# Patient Record
Sex: Male | Born: 1970 | Race: Black or African American | Hispanic: No | Marital: Single | Smoking: Current every day smoker
Health system: Southern US, Community
[De-identification: ages and names within clinical notes are randomized; demographics above are authoritative.]

---

## 1998-12-22 ENCOUNTER — Encounter: Payer: Self-pay | Admitting: Emergency Medicine

## 1998-12-22 ENCOUNTER — Emergency Department (HOSPITAL_COMMUNITY): Admission: EM | Admit: 1998-12-22 | Discharge: 1998-12-22 | Payer: Self-pay | Admitting: Emergency Medicine

## 2020-09-21 ENCOUNTER — Other Ambulatory Visit: Payer: Self-pay

## 2020-09-21 ENCOUNTER — Encounter (HOSPITAL_BASED_OUTPATIENT_CLINIC_OR_DEPARTMENT_OTHER): Payer: Self-pay | Admitting: Emergency Medicine

## 2020-09-21 ENCOUNTER — Emergency Department (HOSPITAL_BASED_OUTPATIENT_CLINIC_OR_DEPARTMENT_OTHER)
Admission: EM | Admit: 2020-09-21 | Discharge: 2020-09-21 | Disposition: A | Discharge status: Home / Self Care | Payer: Self-pay | Attending: Emergency Medicine | Admitting: Emergency Medicine

## 2020-09-21 ENCOUNTER — Emergency Department (HOSPITAL_BASED_OUTPATIENT_CLINIC_OR_DEPARTMENT_OTHER): Payer: Self-pay | Admitting: Radiology

## 2020-09-21 DIAGNOSIS — S4992XA Unspecified injury of left shoulder and upper arm, initial encounter: Secondary | ICD-10-CM | POA: Insufficient documentation

## 2020-09-21 DIAGNOSIS — W228XXA Striking against or struck by other objects, initial encounter: Secondary | ICD-10-CM | POA: Insufficient documentation

## 2020-09-21 NOTE — Discharge Instructions (Signed)
X-ray of the left shoulder without any bony abnormalities.  Discharged in the custody of police.  Would recommend follow-up with sports medicine.  Referral information provided.
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
## 2022-01-15 ENCOUNTER — Emergency Department (HOSPITAL_COMMUNITY)
Admission: EM | Admit: 2022-01-15 | Discharge: 2022-01-16 | Disposition: A | Discharge status: Home / Self Care | Payer: BC Managed Care – PPO | Attending: Emergency Medicine | Admitting: Emergency Medicine

## 2022-01-15 ENCOUNTER — Encounter (HOSPITAL_COMMUNITY): Payer: Self-pay

## 2022-01-15 ENCOUNTER — Other Ambulatory Visit: Payer: Self-pay

## 2022-01-15 DIAGNOSIS — F1410 Cocaine abuse, uncomplicated: Secondary | ICD-10-CM | POA: Diagnosis not present

## 2022-01-15 DIAGNOSIS — Z20822 Contact with and (suspected) exposure to covid-19: Secondary | ICD-10-CM | POA: Insufficient documentation

## 2022-01-15 DIAGNOSIS — R45851 Suicidal ideations: Secondary | ICD-10-CM | POA: Diagnosis not present

## 2022-01-15 DIAGNOSIS — F332 Major depressive disorder, recurrent severe without psychotic features: Secondary | ICD-10-CM | POA: Diagnosis not present

## 2022-01-15 DIAGNOSIS — F32A Depression, unspecified: Secondary | ICD-10-CM | POA: Diagnosis present

## 2022-01-15 LAB — COMPREHENSIVE METABOLIC PANEL
ALT: 31 U/L (ref 0–44)
AST: 39 U/L (ref 15–41)
Albumin: 4.7 g/dL (ref 3.5–5.0)
Alkaline Phosphatase: 75 U/L (ref 38–126)
Anion gap: 7 (ref 5–15)
BUN: 19 mg/dL (ref 6–20)
CO2: 24 mmol/L (ref 22–32)
Calcium: 9.6 mg/dL (ref 8.9–10.3)
Chloride: 106 mmol/L (ref 98–111)
Creatinine, Ser: 0.95 mg/dL (ref 0.61–1.24)
GFR, Estimated: >60 mL/min (ref >60)
Glucose, Bld: 113 mg/dL — ABNORMAL HIGH (ref 70–99)
Potassium: 3.7 mmol/L (ref 3.5–5.1)
Sodium: 137 mmol/L (ref 135–145)
Total Bilirubin: 1.1 mg/dL (ref 0.3–1.2)
Total Protein: 8.3 g/dL — ABNORMAL HIGH (ref 6.5–8.1)

## 2022-01-15 LAB — ETHANOL: Alcohol, Ethyl (B): <10 mg/dL (ref <10)

## 2022-01-15 LAB — CBC
HCT: 44.4 % (ref 39.0–52.0)
Hemoglobin: 14.9 g/dL (ref 13.0–17.0)
MCH: 30.4 pg (ref 26.0–34.0)
MCHC: 33.6 g/dL (ref 30.0–36.0)
MCV: 90.6 fL (ref 80.0–100.0)
Platelets: 350 10*3/uL (ref 150–400)
RBC: 4.9 MIL/uL (ref 4.22–5.81)
RDW: 13.9 % (ref 11.5–15.5)
WBC: 6.1 10*3/uL (ref 4.0–10.5)
nRBC: 0 % (ref 0.0–0.2)

## 2022-01-15 LAB — SARS CORONAVIRUS 2 BY RT PCR: SARS Coronavirus 2 by RT PCR: NEGATIVE

## 2022-01-15 LAB — ACETAMINOPHEN LEVEL: Acetaminophen (Tylenol), Serum: <10 ug/mL — ABNORMAL LOW (ref 10–30)

## 2022-01-15 MED ORDER — SERTRALINE HCL 50 MG PO TABS
100.0000 mg | ORAL_TABLET | Freq: Every day | ORAL | Status: DC
  Administered 2022-01-15 – 2022-01-16 (×2): 100 mg via ORAL
  Filled 2022-01-15 (×2): qty 2

## 2022-01-15 MED ORDER — TRAZODONE HCL 100 MG PO TABS
50.0000 mg | ORAL_TABLET | Freq: Every day | ORAL | Status: DC
  Administered 2022-01-16: 50 mg via ORAL
  Filled 2022-01-15: qty 1

## 2022-01-15 MED ORDER — HYDROXYZINE HCL 25 MG PO TABS
25.0000 mg | ORAL_TABLET | Freq: Three times a day (TID) | ORAL | Status: DC | PRN
  Administered 2022-01-15: 25 mg via ORAL
  Filled 2022-01-15: qty 1

## 2022-01-15 NOTE — ED Notes (Signed)
Per [facility] admissions, pt has been accepted to [facility] 2-west unit. Accepting provider is Dr. Raj. Patient can arrive 01/16/2022 after 6:00am. Number for report is [phone] or [phone]. 

## 2022-01-15 NOTE — Progress Notes (Signed)
Per [facility] admissions, pt has been accepted to [facility] 2-west unit. Accepting provider is Dr. Merrie Roof. Patient can arrive 01/16/2022 after 6:00am. Number for report is [phone] or [phone].   Glennie Isle, MSW, LCSW-A Phone: [phone] Disposition/TOC

## 2022-01-15 NOTE — ED Notes (Signed)
Pt belongings consist of 2 labelled pt bags. 1 bag only has his shoes and bag 2 has his pants, shirt, sweater, earrings, and phone. 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

## 2022-01-15 NOTE — ED Provider Notes (Signed)
[facility] DEPT Provider Note   CSN: [id number] Arrival date & time: 01/15/22  0258     History  Chief Complaint  Patient presents with   Suicidal    Keith Hardy is a 51 y.o. male.  HPI Patient presents with suicidal thoughts.  Substance use.  Reportedly has been using crack cocaine.  Reportedly has history of depression.  Had been on Zoloft.  States he has not had that for the last couple weeks because he has been using drugs heavily.  Does have depression in the past but normally rather well controlled.  Now having suicidal thoughts with a plan to crash his car.  Called police.  Tearful.  Reportedly otherwise healthy.  Denies other drug use.   History reviewed. No pertinent past medical history.  Home Medications Prior to Admission medications   Not on File      Allergies    Patient has no known allergies.    Review of Systems   Review of Systems  Physical Exam Updated Vital Signs BP 113/79 (BP Location: Left Arm)   Pulse 71   Temp 98.5 F (36.9 C) (Oral)   Resp (!) 22   SpO2 95%  Physical Exam Vitals and nursing note reviewed.  HENT:     Head: Atraumatic.  Cardiovascular:     Rate and Rhythm: Normal rate.  Abdominal:     Tenderness: There is no abdominal tenderness.  Musculoskeletal:        General: No tenderness.     Cervical back: Neck supple.  Skin:    General: Skin is warm.  Neurological:     Mental Status: He is alert.  Psychiatric:     Comments: Patient is cheerful.  Does not appear to responding to internal stimuli.     ED Results / Procedures / Treatments   Labs (all labs ordered are listed, but only abnormal results are displayed) Labs Reviewed  COMPREHENSIVE METABOLIC PANEL - Abnormal; Notable for the following components:      Result Value   Glucose, Bld 113 (*)    Total Protein 8.3 (*)    All other components within normal limits  ACETAMINOPHEN LEVEL - Abnormal; Notable for the following components:    Acetaminophen (Tylenol), Serum <10 (*)    All other components within normal limits  SARS CORONAVIRUS 2 BY RT PCR  CBC  ETHANOL  RAPID URINE DRUG SCREEN, HOSP PERFORMED    EKG None  Radiology No results found.  Procedures Procedures    Medications Ordered in ED Medications - No data to display  ED Course/ Medical Decision Making/ A&P                           Medical Decision Making Amount and/or Complexity of Data Reviewed Labs: ordered.   Patient presents for medical clearance.  Suicidal.  Is also substance use with cocaine.  Active suicidal thoughts with plan to crash his car.  We will get basic blood work.  Likely will be medically cleared however.  We will have patient seen by TTS.  Lab work returned and reassuring.  Patient is medically cleared at this time.        Final Clinical Impression(s) / ED Diagnoses Final diagnoses:  Suicidal ideation  Cocaine use disorder [facility])    Rx / DC Orders ED Discharge Orders     None         Davonna Belling, MD 01/15/22 1116

## 2022-01-15 NOTE — Consult Note (Cosign Needed Addendum)
[facility] ED ASSESSMENT   Reason for Consult:  Psychiatry evaluation Referring Physician:  ER Physician Patient Identification: Keith Hardy MRN:  [id number] ED Chief Complaint: Major depressive disorder, recurrent episode, severe ([place])  Diagnosis:  Principal Problem:   Major depressive disorder, recurrent episode, severe ([place]) Active Problems:   Suicide ideation   Cocaine use disorder [facility])   ED Assessment Time Calculation: Start Time: 1522 Stop Time: 1546 Total Time in Minutes (Assessment Completion): 24   Subjective:   Keith Hardy is a 51 y.o. male patient admitted with suicide ideation and severe depression in the contest of homelessness and tired of living in his car.  He was using crack cocaine daily to see if he can end it all.  Patient called PD from his car and was transported here.   First visit to [facility] long ER  but has previous hx of depression and anxiety.Marland Kitchen  HPI:  Patient was seen this afternoon tearful, alert and oriented x 5 and he made minimal eye contact with provider.  Patient reported he is unemployed, [place] but has no relationship with his family members.  Patient was hospitalized 3 weeks ago for same symptoms at [facility] unit in [place].  He reported that he is not with his Zoloft but cannot state if he ran out or he left it some where.  He remains suicidal with plan to hang himself or shoot himself although he has no gun.   He also told the ER physician that he plans to crash his car for suicide ideation.  Patient reported poor sleep and appetite stating Cocaine takes away his appetite.   He reported hopelessness and helplessness. Patient denies HI/AVH and no paranoia.  Patient is irritable at times during our interaction because he believes too much questions are asked.  We will seek inpatient psychiatric hospitalization.  We will also start his Zoloft while seeking bed placement.  Past Psychiatric History: Depression, anxiety  and Cocaine abuse.  Recent inpatient Psychiatric hospitalization at [facility] in [place].  Reported previous suicide attempt by hanging.  Risk to Self or Others: Is the patient at risk to self? Yes Has the patient been a risk to self in the past 6 months? Yes Has the patient been a risk to self within the distant past? Yes Is the patient a risk to others? No Has the patient been a risk to others in the past 6 months? No Has the patient been a risk to others within the distant past? No  [place] Scale:  [place] ED from 01/15/2022 in [facility] DEPT ED from 09/21/2020 in [facility] Emergency Dept  C-SSRS RISK CATEGORY High Risk Error: Question 6 not populated       AIMS:  , , ,  ,   ASAM:    Substance Abuse:     Past Medical History: History reviewed. No pertinent past medical history. History reviewed. No pertinent surgical history. Family History: History reviewed. No pertinent family history. Family Psychiatric  History: unknown Social History:  Social History   Substance and Sexual Activity  Alcohol Use Yes   Comment: Ocasional     Social History   Substance and Sexual Activity  Drug Use Yes   Types: Cocaine    Social History   Socioeconomic History   Marital status: Single    Spouse name: Not on file   Number of children: Not on file   Years of education: Not on  file   Highest education level: Not on file  Occupational History   Not on file  Tobacco Use   Smoking status: Every Day    Types: Cigarettes   Smokeless tobacco: Not on file  Vaping Use   Vaping Use: Never used  Substance and Sexual Activity   Alcohol use: Yes    Comment: Ocasional   Drug use: Yes    Types: Cocaine   Sexual activity: Not on file  Other Topics Concern   Not on file  Social History Narrative   Not on file   Social Determinants of Health   Financial Resource Strain: Not on file  Food Insecurity: Not on file  Transportation  Needs: Not on file  Physical Activity: Not on file  Stress: Not on file  Social Connections: Not on file   Additional Social History:    Allergies:  No Known Allergies  Labs:  Results for orders placed or performed during the hospital encounter of 01/15/22 (from the past 48 hour(s))  Comprehensive metabolic panel     Status: Abnormal   Collection Time: 01/15/22  9:50 AM  Result Value Ref Range   Sodium 137 135 - 145 mmol/L   Potassium 3.7 3.5 - 5.1 mmol/L   Chloride 106 98 - 111 mmol/L   CO2 24 22 - 32 mmol/L   Glucose, Bld 113 (H) 70 - 99 mg/dL    Comment: Glucose reference range applies only to samples taken after fasting for at least 8 hours.   BUN 19 6 - 20 mg/dL   Creatinine, Ser 9.37 0.61 - 1.24 mg/dL   Calcium 9.6 8.9 - 90.2 mg/dL   Total Protein 8.3 (H) 6.5 - 8.1 g/dL   Albumin 4.7 3.5 - 5.0 g/dL   AST 39 15 - 41 U/L   ALT 31 0 - 44 U/L   Alkaline Phosphatase 75 38 - 126 U/L   Total Bilirubin 1.1 0.3 - 1.2 mg/dL   GFR, Estimated >40 >97 mL/min    Comment: (NOTE) Calculated using the CKD-EPI Creatinine Equation (2021)    Anion gap 7 5 - 15    Comment: Performed at [facility], [address] [place], [place] [address]  CBC     Status: None   Collection Time: 01/15/22  9:50 AM  Result Value Ref Range   WBC 6.1 4.0 - 10.5 K/uL   RBC 4.90 4.22 - 5.81 MIL/uL   Hemoglobin 14.9 13.0 - 17.0 g/dL   HCT 92.4 26.8 - 34.1 %   MCV 90.6 80.0 - 100.0 fL   MCH 30.4 26.0 - 34.0 pg   MCHC 33.6 30.0 - 36.0 g/dL   RDW 96.2 22.9 - 79.8 %   Platelets 350 150 - 400 K/uL   nRBC 0.0 0.0 - 0.2 %    Comment: Performed at [facility], [address], [place], [place] [address]  Ethanol     Status: None   Collection Time: 01/15/22  9:50 AM  Result Value Ref Range   Alcohol, Ethyl (B) <10 <10 mg/dL    Comment: (NOTE) Lowest detectable limit for serum alcohol is 10 mg/dL.  For medical purposes only. Performed at [facility], [address] [place], [place] [address]   SARS Coronavirus 2 by RT PCR (hospital order, performed in [facility] hospital lab) *cepheid single result test*     Status: None   Collection Time: 01/15/22  9:50 AM   Specimen: Nasal Swab  Result Value Ref Range   SARS Coronavirus 2 by RT PCR NEGATIVE NEGATIVE    Comment: (NOTE) SARS-CoV-2 target nucleic acids are NOT DETECTED.  The SARS-CoV-2 RNA is generally detectable in upper and lower respiratory specimens during the acute phase of infection. The lowest concentration of SARS-CoV-2 viral copies this assay can detect is 250 copies / mL. A negative result does not preclude SARS-CoV-2 infection and should not be used as the sole basis for treatment or other patient management decisions.  A negative result may occur with improper specimen collection / handling, submission of specimen other than nasopharyngeal swab, presence of viral mutation(s) within the areas targeted by this assay, and inadequate number of viral copies (<250 copies / mL). A negative result must be combined with clinical observations, patient history, and epidemiological information.  Fact Sheet for Patients:   RoadLapTop.co.za  Fact Sheet for Healthcare Providers: http://kim-miller.com/  This test is not yet approved or  cleared by the [place] FDA and has been authorized for detection and/or diagnosis of SARS-CoV-2 by FDA under an Emergency Use Authorization (EUA).  This EUA will remain in effect (meaning this test can be used) for the duration of the COVID-19 declaration under Section 564(b)(1) of the Act, 21 U.S.C. section 360bbb-3(b)(1), unless the authorization is terminated or revoked sooner.  Performed at [facility], [address] [place], [place] [address]   Acetaminophen level     Status: Abnormal   Collection Time: 01/15/22  9:50 AM  Result Value Ref Range    Acetaminophen (Tylenol), Serum <10 (L) 10 - 30 ug/mL    Comment: (NOTE) Therapeutic concentrations vary significantly. A range of 10-30 ug/mL  may be an effective concentration for many patients. However, some  are best treated at concentrations outside of this range. Acetaminophen concentrations >150 ug/mL at 4 hours after ingestion  and >50 ug/mL at 12 hours after ingestion are often associated with  toxic reactions.  Performed at [facility], [address], [place], [place] [address]     Current Facility-Administered Medications  Medication Dose Route Frequency Provider Last Rate Last Admin   hydrOXYzine (ATARAX) tablet 25 mg  25 mg Oral TID PRN Dahlia Byes C, NP       sertraline (ZOLOFT) tablet 100 mg  100 mg Oral Daily Annie Saephan C, NP       traZODone (DESYREL) tablet 50 mg  50 mg Oral QHS Dahlia Byes C, NP       Current Outpatient Medications  Medication Sig Dispense Refill   ibuprofen (ADVIL) 200 MG tablet Take 400 mg by mouth 2 (two) times daily as needed (pain).     sertraline (ZOLOFT) 100 MG tablet Take 100 mg by mouth daily.      Musculoskeletal: Strength & Muscle Tone: within normal limits Gait & Station: normal Patient leans: Right   Psychiatric Specialty Exam: Presentation  General Appearance:  Casual; Fairly Groomed  Eye Contact: Minimal  Speech: Clear and Coherent; Normal Rate  Speech Volume: Normal  Handedness: Right   Mood and Affect  Mood: Depressed; Anxious; Angry; Irritable  Affect: Congruent; Tearful   Thought Process  Thought Processes: Goal Directed; Coherent  Descriptions of Associations:Intact  Orientation:Full (Time, Place and Person)  Thought Content:Logical  History of Schizophrenia/Schizoaffective disorder:No data recorded Duration of Psychotic Symptoms:No data recorded Hallucinations:Hallucinations: None  Ideas of Reference:None  Suicidal Thoughts:Suicidal Thoughts: Yes,  Active SI Active Intent and/or Plan: With Intent; With Plan; With Means to Carry Out; With  Access to Means (Plans to shoot self although he has no gun.  He plans to hang self, reported previous hanging self.)  Homicidal Thoughts:Homicidal Thoughts: No   Sensorium  Memory: Immediate Good; Recent Good; Remote Good  Judgment: Poor  Insight: Fair   Art therapist  Concentration: Good  Attention Span: Good  Recall: Good  Fund of Knowledge: Good  Language: Good   Psychomotor Activity  Psychomotor Activity: Psychomotor Activity: Normal   Assets  Assets: Communication Skills    Sleep  Sleep: Sleep: Fair   Physical Exam: Physical Exam Vitals and nursing note reviewed.  Constitutional:      Appearance: Normal appearance.  HENT:     Head: Normocephalic and atraumatic.     Nose: Nose normal.  Cardiovascular:     Rate and Rhythm: Normal rate and regular rhythm.  Pulmonary:     Effort: Pulmonary effort is normal.  Musculoskeletal:        General: Normal range of motion.     Cervical back: Normal range of motion.  Skin:    General: Skin is warm and dry.  Neurological:     Mental Status: He is alert and oriented to person, place, and time.    Review of Systems  Constitutional: Negative.   HENT: Negative.    Eyes: Negative.   Respiratory: Negative.    Cardiovascular: Negative.   Gastrointestinal: Negative.   Genitourinary: Negative.   Musculoskeletal: Negative.   Skin: Negative.   Neurological: Negative.   Endo/Heme/Allergies: Negative.   Psychiatric/Behavioral:  Positive for depression, substance abuse and suicidal ideas. The patient is nervous/anxious.    Blood pressure 113/79, pulse 71, temperature 98.5 F (36.9 C), temperature source Oral, resp. rate (!) 22, SpO2 95 %. There is no height or weight on file to calculate BMI.  Medical Decision Making: Patient is a danger to self and remains suicidal .  He has previous suicide attempt by  hanging.  He is hopeless and helpless and will need inpatient Psychiatric hospitalization.  We have resumed his Zoloft, added Trazodone for sleep and Hydroxyzine for anxiety.  Records will be faxed out to any facility with available beds.  Problem 1: Recurrent Major Depressive disorder, severe without Psychotic features.  Problem 2: Suicide ideation.  Disposition:  Admit, seek bed placement.  Earney Navy, NP-PMHNP-BC 01/15/2022 3:47 PM

## 2022-01-15 NOTE — ED Triage Notes (Addendum)
[employer] reports patient homeless, living in car and states that he wants to harm himself. He has been using crack for a couple of day per police woman. [employer] states that patient called PD from his car and stated that he needs help, and they transported him here.

## 2022-01-15 NOTE — Progress Notes (Signed)
Per Josephine Onuoha, NP, patient meets criteria for inpatient treatment. There are no available beds at [facility] today. CSW faxed referrals to the following facilities for review:  [facility]  Pending - Request Sent N/A [address] [place] [address] [phone] --  [facility]  Pending - Request Sent N/A [address] [place] [address] [phone] --  [facility]  Pending - Request Sent N/A [address] [place] [address] [phone] --  [facility]  Pending - Request Sent N/A [address], [place] [address] [phone] --  [facility]  Pending - Request Sent N/A [address], [place] [address] [phone] --  [facility]  Pending - Request Sent N/A [address] [place] [address] [phone] --  [facility]  Pending - Request Sent N/A [address] [place] [address] [phone] --  [facility]  Pending - Request Sent N/A [address] [place] [address] [phone] --  [facility]  Pending - Request Sent N/A [address] [place] [address] [phone] --  [facility]  Pending - Request Sent N/A [address], [place] [address] [phone] --  [facility]  Pending - Request Sent N/A [address], [place] [address] [phone] --  [facility]  Pending - Request Sent N/A [address] [place] [address] [phone] --  [facility]  Pending - Request Sent N/A [address], [place] [address] [phone] --  [facility]  Pending - Request Sent N/A [address], [place] [address] [phone] --   TTS will continue to seek bed placement.  Terel Bann, MSW, LCSW-A, LCAS Phone: [phone] Disposition/TOC  

## 2022-01-16 NOTE — ED Provider Notes (Signed)
Emergency Medicine Observation Re-evaluation Note  Mousa Prout is a 51 y.o. male, seen on rounds today.  Pt initially presented to the ED for complaints of Suicidal Currently, the patient is eating breakfast.  Physical Exam  BP 104/84 (BP Location: Right Arm)   Pulse 88   Temp 98.1 F (36.7 C) (Oral)   Resp 16   SpO2 98%  Physical Exam General: Awake, alert, nondistressed Cardiac: Extremities well-perfused Lungs: Breathing is unlabored Psych: No agitation  ED Course / MDM  EKG:EKG Interpretation  Date/Time:  Sunday January 15 2022 10:20:44 EDT Ventricular Rate:  64 PR Interval:  164 QRS Duration: 88 QT Interval:  396 QTC Calculation: 408 R Axis:   45 Text Interpretation: Normal sinus rhythm Cannot rule out Anterior infarct , age undetermined Abnormal ECG No previous ECGs available Confirmed by Davonna Belling [phone]) on 01/15/2022 2:47:30 PM  I have reviewed the labs performed to date as well as medications administered while in observation.  Recent changes in the last 24 hours include accepted to [facility] [place].  Plan  Current plan is for transfer to [facility] this morning.    Godfrey Pick, MD 01/16/22 [phone]

## 2022-01-16 NOTE — ED Notes (Signed)
Report attempted to [facility]. Nurse unavailable at this time.

## 2022-01-16 NOTE — ED Notes (Signed)
Per [facility] admissions, pt has been accepted to [facility] unit. Accepting provider is Dr. Merrie Roof. Patient can arrive 01/16/2022 after 6:00am. Number for report is [phone] or [phone].

## 2022-01-16 NOTE — ED Notes (Signed)
Safe transport contacted for transport to [facility]

## 2022-01-25 ENCOUNTER — Emergency Department (HOSPITAL_COMMUNITY)
Admission: EM | Admit: 2022-01-25 | Discharge: 2022-01-26 | Disposition: A | Discharge status: Home / Self Care | Payer: BC Managed Care – PPO | Attending: Emergency Medicine | Admitting: Emergency Medicine

## 2022-01-25 ENCOUNTER — Other Ambulatory Visit: Payer: Self-pay

## 2022-01-25 ENCOUNTER — Encounter (HOSPITAL_COMMUNITY): Payer: Self-pay | Admitting: Emergency Medicine

## 2022-01-25 DIAGNOSIS — R0981 Nasal congestion: Secondary | ICD-10-CM | POA: Insufficient documentation

## 2022-01-25 DIAGNOSIS — R051 Acute cough: Secondary | ICD-10-CM | POA: Diagnosis present

## 2022-01-25 DIAGNOSIS — N4889 Other specified disorders of penis: Secondary | ICD-10-CM | POA: Diagnosis not present

## 2022-01-25 DIAGNOSIS — R7309 Other abnormal glucose: Secondary | ICD-10-CM | POA: Insufficient documentation

## 2022-01-25 DIAGNOSIS — Z20822 Contact with and (suspected) exposure to covid-19: Secondary | ICD-10-CM | POA: Diagnosis not present

## 2022-01-25 LAB — RESP PANEL BY RT-PCR (FLU A&B, COVID) ARPGX2
Influenza A by PCR: NEGATIVE
Influenza B by PCR: NEGATIVE
SARS Coronavirus 2 by RT PCR: NEGATIVE

## 2022-01-25 MED ORDER — DOXYCYCLINE HYCLATE 100 MG PO TABS
100.0000 mg | ORAL_TABLET | Freq: Once | ORAL | Status: AC
  Administered 2022-01-26: 100 mg via ORAL
  Filled 2022-01-25: qty 1

## 2022-01-25 MED ORDER — CEFTRIAXONE SODIUM 1 G IJ SOLR
500.0000 mg | Freq: Once | INTRAMUSCULAR | Status: AC
  Administered 2022-01-26: 500 mg via INTRAMUSCULAR
  Filled 2022-01-25: qty 10

## 2022-01-25 MED ORDER — IPRATROPIUM BROMIDE 0.03 % NA SOLN: 2.0000 | Freq: Two times a day (BID) | NASAL | 0 refills | Status: AC

## 2022-01-25 MED ORDER — OXYCODONE-ACETAMINOPHEN 5-325 MG PO TABS
2.0000 | ORAL_TABLET | Freq: Once | ORAL | Status: AC
  Administered 2022-01-26: 2 via ORAL
  Filled 2022-01-25: qty 2

## 2022-01-25 MED ORDER — AMOXICILLIN-POT CLAVULANATE 875-125 MG PO TABS: 1.0000 | ORAL_TABLET | Freq: Two times a day (BID) | ORAL | 0 refills | Status: DC

## 2022-01-25 MED ORDER — AMOXICILLIN-POT CLAVULANATE 875-125 MG PO TABS: 1.0000 | ORAL_TABLET | Freq: Once | ORAL | Status: DC

## 2022-01-25 NOTE — ED Triage Notes (Signed)
Pt c/o cough since Sunday, pt states he has been taking OTC meds.

## 2022-01-26 ENCOUNTER — Encounter (HOSPITAL_COMMUNITY): Payer: Self-pay | Admitting: Emergency Medicine

## 2022-01-26 LAB — CBG MONITORING, ED: Glucose-Capillary: 127 mg/dL — ABNORMAL HIGH (ref 70–99)

## 2022-01-26 MED ORDER — DOXYCYCLINE HYCLATE 100 MG PO CAPS: 100.0000 mg | ORAL_CAPSULE | Freq: Two times a day (BID) | ORAL | 0 refills | Status: DC

## 2022-01-26 MED ORDER — LIDOCAINE HCL (PF) 1 % IJ SOLN
INTRAMUSCULAR | Status: AC
  Administered 2022-01-26: 2.1 mL via SUBCUTANEOUS
  Filled 2022-01-26: qty 30

## 2022-01-26 MED ORDER — BACITRACIN ZINC 500 UNIT/GM EX OINT
TOPICAL_OINTMENT | CUTANEOUS | Status: DC | PRN
  Administered 2022-01-26: 2 via TOPICAL
  Filled 2022-01-26: qty 2.7

## 2022-01-26 MED ORDER — LIDOCAINE HCL (PF) 1 % IJ SOLN: 2.1000 mL | Freq: Once | INTRAMUSCULAR | Status: AC

## 2022-01-26 MED ORDER — DOXYCYCLINE HYCLATE 100 MG PO CAPS: 100.0000 mg | ORAL_CAPSULE | Freq: Two times a day (BID) | ORAL | 0 refills | Status: AC

## 2022-01-26 NOTE — ED Provider Notes (Signed)
[facility] Emergency Department Provider Note MRN:  [id number]  [place] date & time: 01/26/22     Chief Complaint   Cough   History of Present Illness   Keith Hardy is a 51 y.o. year-old male presents to the ED with chief complaint of 2 complaints.  Patient reports having worsening cough and sinus congestion x1 week.  Reports trying OTC meds without relief.  Denies fevers or chills.  Denies productive cough. Patient also complains of irritation around his penis.  He states that he was masturbating about a week ago and now has had oozing and discharge from the head of the penis.  He states that it is sore and painful.  He denies any pain or swelling in his testicles.  He states that he is able to urinate.  Denies fevers.  History provided by patient.   Review of Systems  Pertinent positive and negative review of systems noted in HPI.    Physical Exam   Vitals:   01/26/22 0010 01/26/22 0152  BP:  (!) 125/95  Pulse:  92  Resp:  18  Temp:  98.6 F (37 C)  SpO2: 96% 96%    CONSTITUTIONAL:  well-appearing, NAD NEURO:  Alert and oriented x 3, CN 3-12 grossly intact EYES:  eyes equal and reactive ENT/NECK:  Supple, no stridor  CARDIO:  appears well-perfused  PULM:  No respiratory distress,  GI/GU:  non-distended, irritation of the penile glans with thin yellow discharge MSK/SPINE:  No gross deformities, no edema, moves all extremities  SKIN:  no rash, atraumatic   *Additional and/or pertinent findings included in MDM below  Diagnostic and Interventional Summary    EKG Interpretation  Date/Time:    Ventricular Rate:    PR Interval:    QRS Duration:   QT Interval:    QTC Calculation:   R Axis:     Text Interpretation:         Labs Reviewed  CBG MONITORING, ED - Abnormal; Notable for the following components:      Result Value   Glucose-Capillary 127 (*)    All other components within normal limits  RESP PANEL BY RT-PCR (FLU A&B,  COVID) [id number]    No orders to display    Medications  bacitracin ointment (2 Applications Topical Given 01/26/22 0152)  doxycycline (VIBRA-TABS) tablet 100 mg (100 mg Oral Given 01/26/22 0020)  cefTRIAXone (ROCEPHIN) injection 500 mg (500 mg Intramuscular Given 01/26/22 0022)  oxyCODONE-acetaminophen (PERCOCET/ROXICET) 5-325 MG per tablet 2 tablet (2 tablets Oral Given 01/26/22 0020)  lidocaine (PF) (XYLOCAINE) 1 % injection 2.1 mL (2.1 mLs Subcutaneous Given 01/26/22 0023)     Procedures  /  Critical Care Procedures  ED Course and Medical Decision Making  I have reviewed the triage vital signs, the nursing notes, and pertinent available records from the EMR.  Social Determinants Affecting Complexity of Care: Patient has no clinically significant social determinants affecting this chief complaint..   ED Course:    Medical Decision Making Patient here with sinus infection.  We will treat with doxycycline.  He also has irritation and soreness to the head of his pain with yellow discharge around the glans, but without discharge from the urethral meatus.  No sign of abscess.  Problems Addressed: Acute cough: acute illness or injury Sore of penis: undiagnosed new problem with uncertain prognosis  Amount and/or Complexity of Data Reviewed Labs: ordered.    Details: CBG 127, doubt new onset DM Covid and flu negative  Risk OTC drugs. Prescription drug management.     Consultants: No consultations were needed in caring for this patient.   Treatment and Plan: Emergency department workup does not suggest an emergent condition requiring admission or immediate intervention beyond  what has been performed at this time. The patient is safe for discharge and has  been instructed to return immediately for worsening symptoms, change in  symptoms or any other concerns    Final Clinical Impressions(s) / ED Diagnoses     ICD-10-CM   1. Acute cough  R05.1     2. Sore of penis   N48.89       ED Discharge Orders          Ordered    doxycycline (VIBRAMYCIN) 100 MG capsule  2 times daily        01/26/22 0133    amoxicillin-clavulanate (AUGMENTIN) 875-125 MG tablet  Every 12 hours,   Status:  Discontinued        01/25/22 2354    ipratropium (ATROVENT) 0.03 % nasal spray  Every 12 hours        01/25/22 2354              Discharge Instructions Discussed with and Provided to Patient:   Discharge Instructions   None      Montine Circle, PA-C 01/26/22 0301    Horton, Barbette Hair, MD 02/02/22 2255

## 2022-01-29 ENCOUNTER — Encounter (HOSPITAL_COMMUNITY): Payer: Self-pay

## 2022-01-29 ENCOUNTER — Emergency Department (HOSPITAL_COMMUNITY): Payer: BC Managed Care – PPO

## 2022-01-29 ENCOUNTER — Emergency Department (HOSPITAL_COMMUNITY)
Admission: EM | Admit: 2022-01-29 | Discharge: 2022-01-29 | Disposition: A | Discharge status: Home / Self Care | Payer: BC Managed Care – PPO | Attending: Emergency Medicine | Admitting: Emergency Medicine

## 2022-01-29 DIAGNOSIS — E860 Dehydration: Secondary | ICD-10-CM | POA: Diagnosis not present

## 2022-01-29 DIAGNOSIS — R400 Somnolence: Secondary | ICD-10-CM | POA: Insufficient documentation

## 2022-01-29 DIAGNOSIS — T50901A Poisoning by unspecified drugs, medicaments and biological substances, accidental (unintentional), initial encounter: Secondary | ICD-10-CM | POA: Insufficient documentation

## 2022-01-29 DIAGNOSIS — T50904A Poisoning by unspecified drugs, medicaments and biological substances, undetermined, initial encounter: Secondary | ICD-10-CM

## 2022-01-29 LAB — CK: Total CK: 583 U/L — ABNORMAL HIGH (ref 49–397)

## 2022-01-29 LAB — RAPID URINE DRUG SCREEN, HOSP PERFORMED
Amphetamines: NOT DETECTED
Barbiturates: NOT DETECTED
Benzodiazepines: NOT DETECTED
Cocaine: POSITIVE — AB
Opiates: NOT DETECTED
Tetrahydrocannabinol: NOT DETECTED

## 2022-01-29 LAB — COMPREHENSIVE METABOLIC PANEL
ALT: 47 U/L — ABNORMAL HIGH (ref 0–44)
AST: 56 U/L — ABNORMAL HIGH (ref 15–41)
Albumin: 3.7 g/dL (ref 3.5–5.0)
Alkaline Phosphatase: 88 U/L (ref 38–126)
Anion gap: 5 (ref 5–15)
BUN: 22 mg/dL — ABNORMAL HIGH (ref 6–20)
CO2: 30 mmol/L (ref 22–32)
Calcium: 9.2 mg/dL (ref 8.9–10.3)
Chloride: 103 mmol/L (ref 98–111)
Creatinine, Ser: 1.33 mg/dL — ABNORMAL HIGH (ref 0.61–1.24)
GFR, Estimated: >60 mL/min (ref >60)
Glucose, Bld: 104 mg/dL — ABNORMAL HIGH (ref 70–99)
Potassium: 4.4 mmol/L (ref 3.5–5.1)
Sodium: 138 mmol/L (ref 135–145)
Total Bilirubin: 0.4 mg/dL (ref 0.3–1.2)
Total Protein: 7.5 g/dL (ref 6.5–8.1)

## 2022-01-29 LAB — CBC WITH DIFFERENTIAL/PLATELET
Abs Immature Granulocytes: 0.08 10*3/uL — ABNORMAL HIGH (ref 0.00–0.07)
Basophils Absolute: 0 10*3/uL (ref 0.0–0.1)
Basophils Relative: 0 %
Eosinophils Absolute: 0 10*3/uL (ref 0.0–0.5)
Eosinophils Relative: 0 %
HCT: 42 % (ref 39.0–52.0)
Hemoglobin: 13.6 g/dL (ref 13.0–17.0)
Immature Granulocytes: 1 %
Lymphocytes Relative: 6 %
Lymphs Abs: 0.8 10*3/uL (ref 0.7–4.0)
MCH: 30.2 pg (ref 26.0–34.0)
MCHC: 32.4 g/dL (ref 30.0–36.0)
MCV: 93.1 fL (ref 80.0–100.0)
Monocytes Absolute: 0.5 10*3/uL (ref 0.1–1.0)
Monocytes Relative: 4 %
Neutro Abs: 12.3 10*3/uL — ABNORMAL HIGH (ref 1.7–7.7)
Neutrophils Relative %: 89 %
Platelets: 396 10*3/uL (ref 150–400)
RBC: 4.51 MIL/uL (ref 4.22–5.81)
RDW: 13.4 % (ref 11.5–15.5)
WBC: 13.7 10*3/uL — ABNORMAL HIGH (ref 4.0–10.5)
nRBC: 0 % (ref 0.0–0.2)

## 2022-01-29 LAB — ETHANOL: Alcohol, Ethyl (B): <10 mg/dL (ref <10)

## 2022-01-29 MED ORDER — LACTATED RINGERS IV BOLUS
1000.0000 mL | Freq: Once | INTRAVENOUS | Status: AC
  Administered 2022-01-29: 1000 mL via INTRAVENOUS

## 2022-01-29 NOTE — ED Provider Notes (Signed)
Signout from Dr. Nechama Guard.  51 year old male brought in by EMS for unresponsive possible drug overdose.  He has received some Narcan with some improvement in his mental status.  Still very somnolent.  Plan is for reassessment when more awake. Physical Exam  BP 122/84   Pulse 72   Temp 98.4 F (36.9 C) (Oral)   Resp 14   SpO2 94%   Physical Exam  Procedures  Procedures  ED Course / MDM   Clinical Course as of 01/29/22 1532  Sun Jan 29, 2022  1509 Labs reviewed, leukocytosis 13.7 with left shift with mild AKI creatinine 1.33 and mild transaminitis AST 56 ALT 47.  Likely all related to dehydration and polysubstance abuse.  Chest x-ray with no pneumonia or pneumothorax.  CT head personally reviewed with no ICH or concerning findings.  Started patient on IV fluids, continuing to monitor.  End-tidal in the 30s still maintaining airway.  Signed out to Dr. Melina Copa pending reassessment and continued monitoring. [VB]    Clinical Course User Index [VB] Elgie Congo, MD   Medical Decision Making Amount and/or Complexity of Data Reviewed Labs: ordered. Radiology: ordered.   5 PM-patient arousable to light stimulation.  Unfortunately does not stay awake.  We will continue to observe.  7 PM, patient arousable to voice will not stay awake.  Not able to converse.  We will continue to observe  9:20 PM.  Patient now awake and alert.  His wife is on his way to come pick him up.       Hayden Rasmussen, MD 01/30/22 [phone]

## 2022-01-29 NOTE — ED Notes (Signed)
Pt care taken, attempting to urinate. No other complaints at this time.

## 2022-01-29 NOTE — ED Provider Notes (Signed)
[facility]-EMERGENCY DEPT Provider Note   CSN: [id number] Arrival date & time: 01/29/22  1302     History  Chief Complaint  Patient presents with   Drug Overdose    Vinny Colee is a 51 y.o. male.  With PMH of depression and polysubstance abuse brought in by EMS from [facility] found unresponsive with pinpoint pupils and bag of white unknown powder given 4 mg IN Narcan and 0.25 mg IV Narcan with some improvement.  Patient was unable to tell EMS or providers on arrival what he took.  He is very somnolent and unable to provide a good history.  According to EMS there is no evidence of head trauma or other drugs present at site.  On chart review, patient has presented in the past endorsing crack and cocaine use.   Drug Overdose       Home Medications Prior to Admission medications   Medication Sig Start Date End Date Taking? Authorizing Provider  doxycycline (VIBRAMYCIN) 100 MG capsule Take 1 capsule (100 mg total) by mouth 2 (two) times daily. 01/26/22   Roxy Horseman, PA-C  ibuprofen (ADVIL) 200 MG tablet Take 400 mg by mouth 2 (two) times daily as needed (pain).    [provider]  ipratropium (ATROVENT) 0.03 % nasal spray Place 2 sprays into both nostrils every 12 (twelve) hours. 01/25/22   Roxy Horseman, PA-C  sertraline (ZOLOFT) 100 MG tablet Take 100 mg by mouth daily.    [provider]      Allergies    Patient has no known allergies.    Review of Systems   Review of Systems  Physical Exam Updated Vital Signs BP 115/86   Pulse 68   Temp 98.4 F (36.9 C) (Oral)   Resp 12   SpO2 96%  Physical Exam Constitutional: Somnolent, will open eyes to painful stimulus, will groan to painful stimulus but will not speak or answer questions, localizes to pain Eyes: Conjunctivae are normal. ENT      Head: Normocephalic and atraumatic.      Nose: No congestion.      Mouth/Throat: Mucous membranes are moist.      Neck: No  stridor. Cardiovascular: S1, S2,  Normal and symmetric distal pulses are present in all extremities.Warm and well perfused. Respiratory: Normal respiratory effort.  Transmitted upper airway sounds.  O2 sat 99 to 100% on room air.  Respiratory rate 16. Gastrointestinal: Soft and nontender.  Musculoskeletal: no pitting edema Neurologic: Intermittent movement of all 4 extremities. Pupils pinpoint, equal and reactive.  Somnolent, will open eyes to painful stimulus, will groan to painful stimulus but will not speak or answer questions, localizes to pain. GCS 9/ Skin: Skin is warm, dry .  No obvious track marks Psychiatric: Somnolent  ED Results / Procedures / Treatments   Labs (all labs ordered are listed, but only abnormal results are displayed) Labs Reviewed  COMPREHENSIVE METABOLIC PANEL - Abnormal; Notable for the following components:      Result Value   Glucose, Bld 104 (*)    BUN 22 (*)    Creatinine, Ser 1.33 (*)    AST 56 (*)    ALT 47 (*)    All other components within normal limits  CBC WITH DIFFERENTIAL/PLATELET - Abnormal; Notable for the following components:   WBC 13.7 (*)    Neutro Abs 12.3 (*)    Abs Immature Granulocytes 0.08 (*)    All other components within normal limits  ETHANOL  RAPID URINE DRUG SCREEN, HOSP PERFORMED    EKG EKG Interpretation  Date/Time:  Sunday January 29 2022 13:28:45 EDT Ventricular Rate:  71 PR Interval:  173 QRS Duration: 91 QT Interval:  395 QTC Calculation: 430 R Axis:   60 Text Interpretation: Sinus rhythm Confirmed by Georgina Snell [phone]) on 01/29/2022 1:39:18 PM  Radiology CT Head Wo Contrast  Result Date: 01/29/2022 CLINICAL DATA:  Altered mental status EXAM: CT HEAD WITHOUT CONTRAST TECHNIQUE: Contiguous axial images were obtained from the base of the skull through the vertex without intravenous contrast. RADIATION DOSE REDUCTION: This exam was performed according to the departmental dose-optimization program which  includes automated exposure control, adjustment of the mA and/or kV according to patient size and/or use of iterative reconstruction technique. COMPARISON:  None Available. FINDINGS: Brain: No evidence of acute infarction, hemorrhage, hydrocephalus, extra-axial collection or mass lesion/mass effect. Gray-white differentiation is preserved. Vascular: No hyperdense vessel or unexpected calcification. Skull: Normal. Negative for fracture or focal lesion. Sinuses/Orbits: There is mucosal thickening in the left maxillary sinus and layering fluid in the right maxillary sinus. Other: None. IMPRESSION: 1. No acute intracranial pathology. 2. Layering fluid in the right maxillary sinus which can be seen with acute sinusitis in the correct clinical setting. Electronically Signed   By: Valetta Mole M.D.   On: 01/29/2022 14:48   DG Chest Portable 1 View  Result Date: 01/29/2022 CLINICAL DATA:  Found unconscious, possible drug overdose EXAM: PORTABLE CHEST 1 VIEW COMPARISON:  None Available. FINDINGS: Transverse diameter of heart is increased. There are no signs of pulmonary edema or focal pulmonary consolidation. There is no pleural effusion or pneumothorax. IMPRESSION: There are no focal infiltrates or signs of pulmonary edema. Electronically Signed   By: Elmer Picker M.D.   On: 01/29/2022 13:58    Procedures Procedures    Medications Ordered in ED Medications  lactated ringers bolus 1,000 mL (1,000 mLs Intravenous New Bag/Given 01/29/22 1448)    ED Course/ Medical Decision Making/ A&P Clinical Course as of 01/29/22 1510  Sun Jan 29, 2022  1509 Labs reviewed, leukocytosis 13.7 with left shift with mild AKI creatinine 1.33 and mild transaminitis AST 56 ALT 47.  Likely all related to dehydration and polysubstance abuse.  Chest x-ray with no pneumonia or pneumothorax.  CT head personally reviewed with no ICH or concerning findings.  Started patient on IV fluids, continuing to monitor.  End-tidal in the  30s still maintaining airway.  Signed out to Dr. Melina Copa pending reassessment and continued monitoring. [VB]    Clinical Course User Index [VB] Elgie Congo, MD                           Medical Decision Making Crosley Stejskal is a 52 y.o. male.  With PMH of depression and polysubstance abuse brought in by EMS from [facility] found unresponsive with pinpoint pupils and bag of white unknown powder given 4 mg IN Narcan and 0.25 mg IV Narcan with some improvement.   Patient presents somnolent with a GCS of 9 protecting airway.  Breathing at a rate of approximately 16-20 placed on end-tidal capnography monitoring with numbers in 40s satting 96-99 on RA.  Pupils are pinpoint.  Otherwise vital signs unremarkable with normal heart rate and blood pressure.  Patient had unknown suspected inhalation/drug use likely opioid component contributing to reported initial bradypnea and pinpoint pupils that improved with Narcan but also likely other drug use.  Do not  think he requires any further Narcan upon arrival since he is protecting airway and not protect neck with normal end-tidal monitoring.  We will continue to keep patient on continuous pulse ox and capnography and re- dose Narcan as needed as he metabolizes.  We will obtain basic labs to ensure no acute electrolyte abnormalities, ethanol level, UDS, chest x-ray and CT head.  Disposition pending observation.  Reassessment.  Amount and/or Complexity of Data Reviewed Labs: ordered. Radiology: ordered and independent interpretation performed.    Details: Chest x-ray personally interpreted, no pneumothorax or pulmonary edema.  Agree with radiologist negative read..      Final Clinical Impression(s) / ED Diagnoses Final diagnoses:  Overdose of undetermined intent, initial encounter  Dehydration    Rx / DC Orders ED Discharge Orders     None         Elgie Congo, MD 01/29/22 1510

## 2022-01-29 NOTE — Discharge Instructions (Signed)
You were seen in the emergency department today for problems related to ongoing substance/alcohol abuse. Although it is easier said than done, I strongly encourage you to make the decision to stop using substances, as your use has become problematic enough to bring you to the emergency department today.   Return to the emergency department immediately if you develop difficulty breathing, thoughts of hurting yourself or hurting someone else, begin experiencing hallucinations, chest pain or any other concerning symptoms.  Please make an appointment to follow up with your primary care doctor within one week to assure improvement or resolution in symptoms. If you do not have a primary care doctor, resources for obtaining one have been included. I have also included detoxification/substance abuse resources below, should you decide that you need to enter detox.   [employer] [facility] [place]  6-712-458-0998  http://[email or web address]://www.bailey.com/  [facility]) treatment locator  verticolony.com.aspx  [facility] ([place])  [place], [place]  Telephone: [phone]  Fax: [phone]

## 2022-01-29 NOTE — ED Triage Notes (Signed)
EMS reports from hallway of [facility]. [facility] called for man unconscious in hallway. Pt found with two baggies of unknown powder along with Pt. Given 4mg  nasal. EMS states RR 10 on arrival and given 0.25 Narcan IV with response.   BP 128/87 HR 86 RR 16 Sp02 96 RA CBG 98  18ga LAC

## 2022-05-18 IMAGING — DX DG SHOULDER 2+V*L*
3 series · 3 of 3 positions shown · non-contrast
Comparison: Degenerative changes in the AC joint with joint space
narrowing and spurring.

CLINICAL DATA: Left shoulder injury, pain

EXAM:
LEFT SHOULDER - 2+ VIEW

[shoulder grashey]
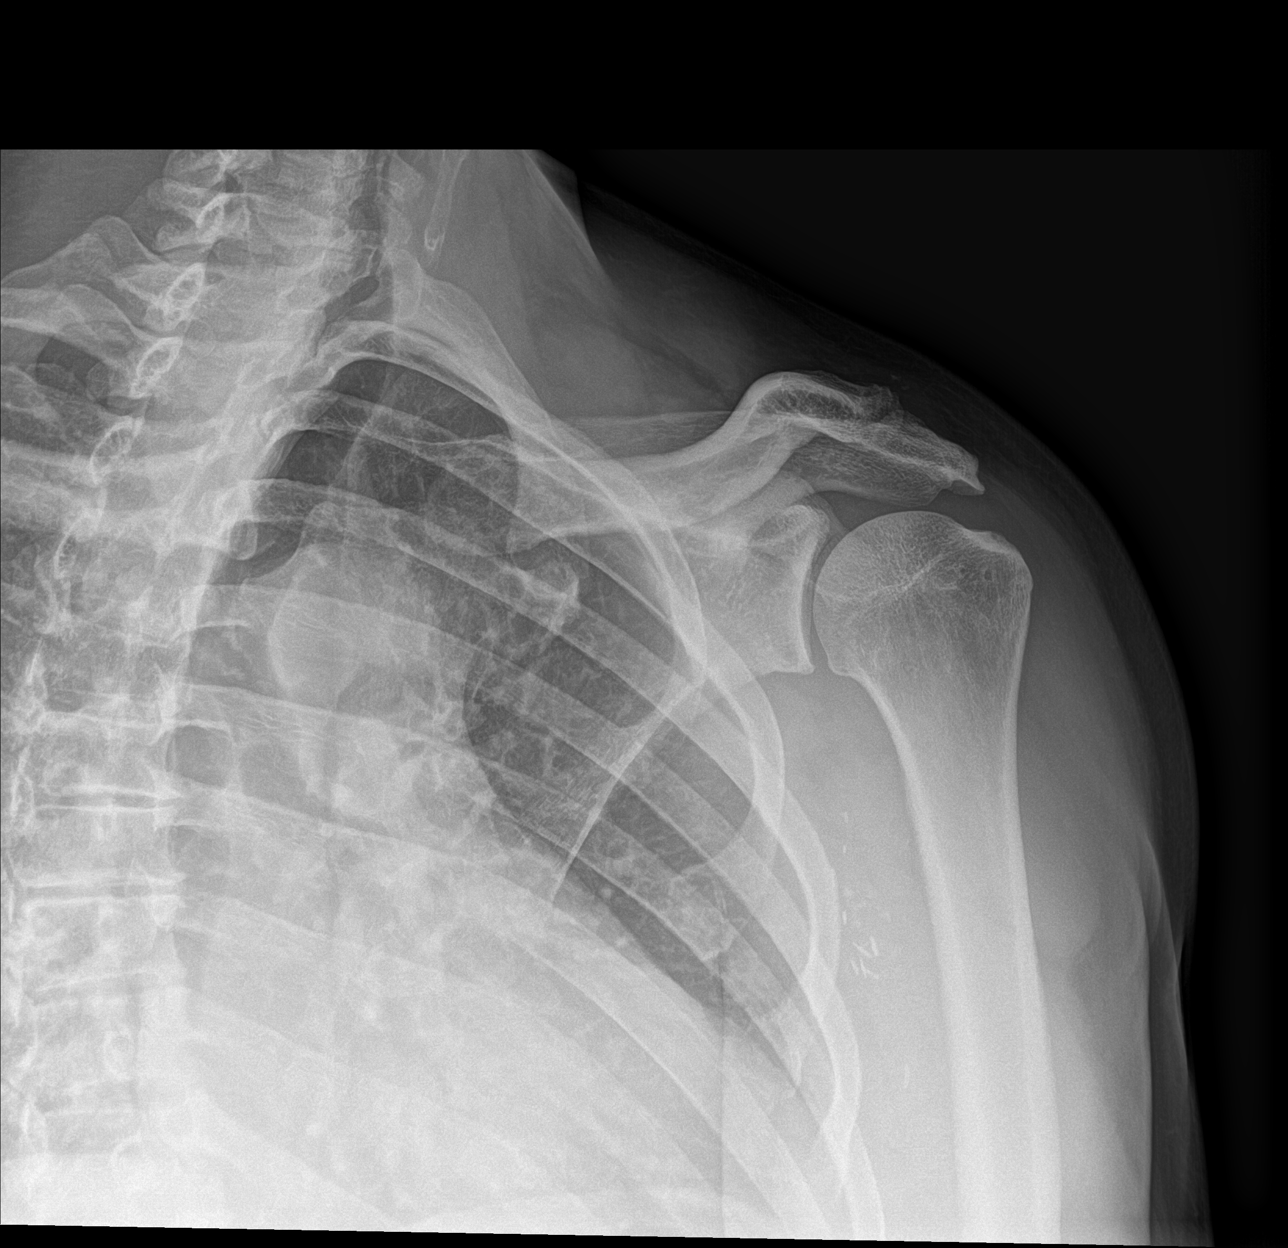

[shoulder y view]
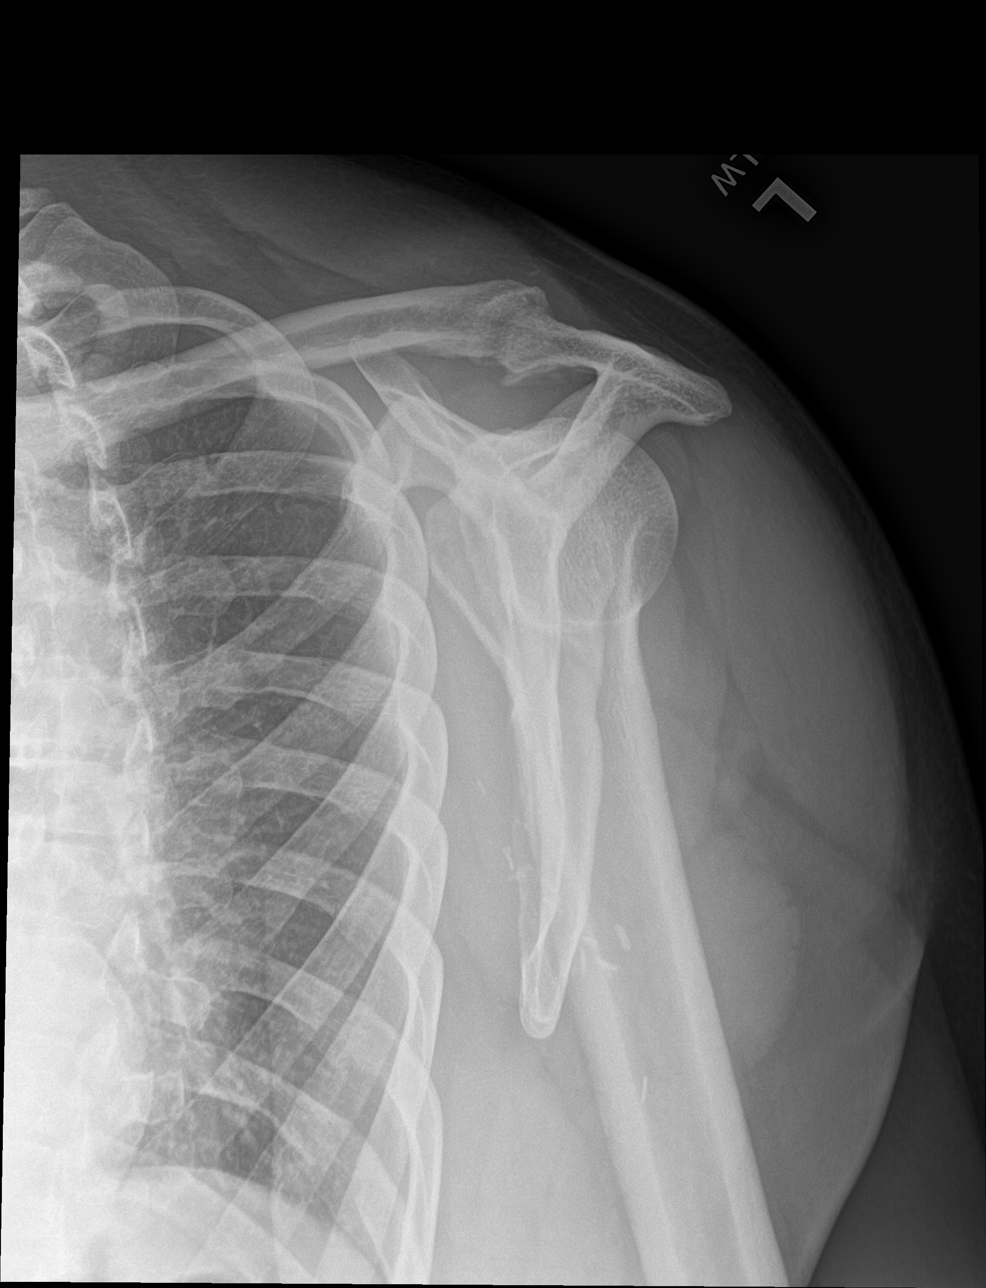

[shoulder axillary]
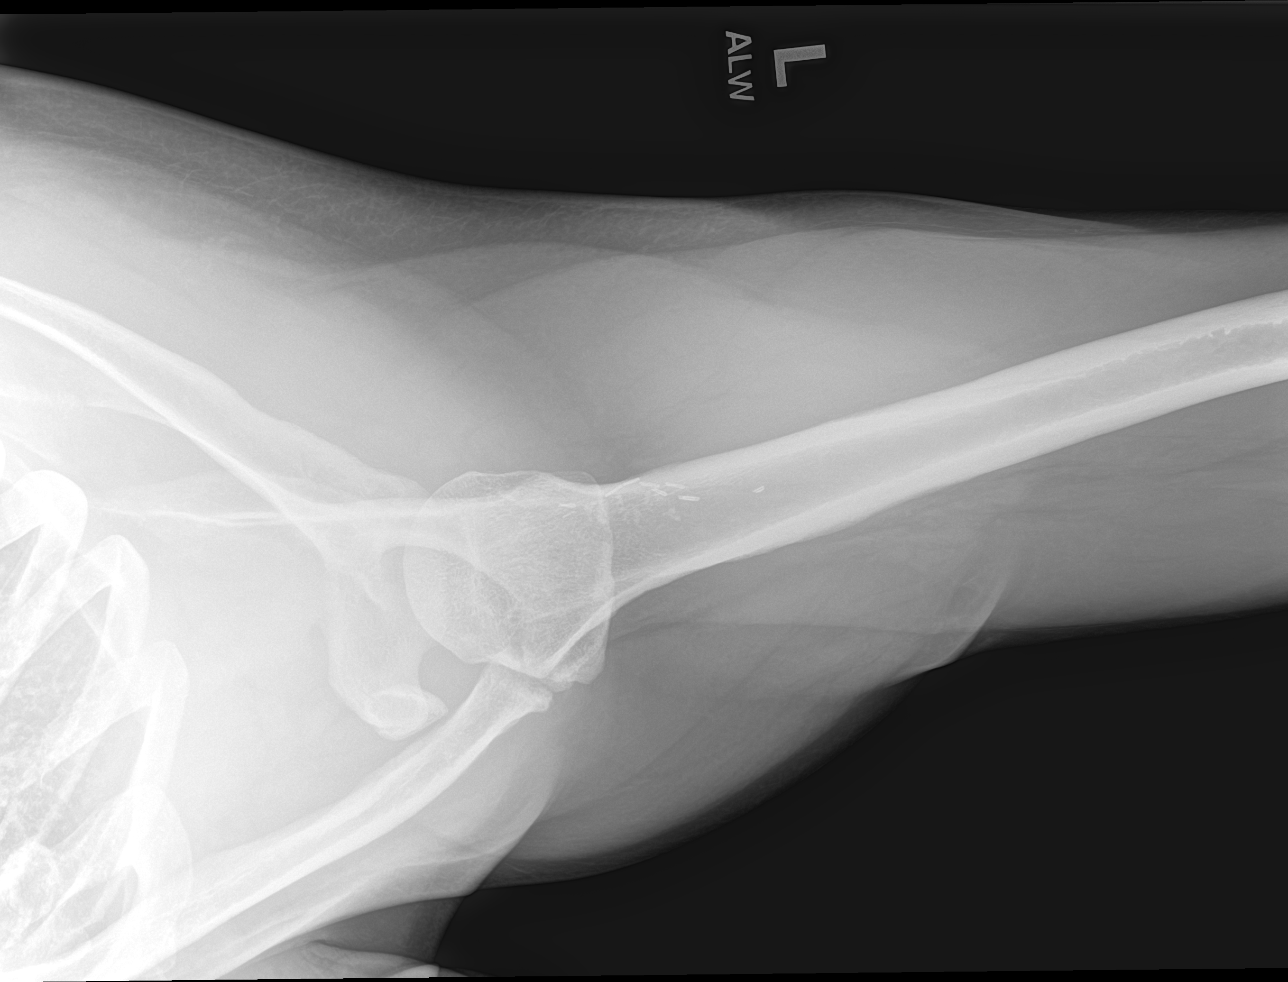

[3 of 3 positions shown; findings below may reference images not displayed]

Glenohumeral joint is maintained. No acute
bony abnormality. Specifically, no fracture, subluxation, or
dislocation. Soft tissues are intact.
FINDINGS: Degenerative changes in the left AC joint. No acute bony
abnormality.
IMPRESSION: Negative.
